# Patient Record
Sex: Female | Born: 1990 | Race: Black or African American | Hispanic: No | Marital: Single | Smoking: Never smoker
Health system: Southern US, Community
[De-identification: ages and names within clinical notes are randomized; demographics above are authoritative.]

---

## 2014-11-17 ENCOUNTER — Emergency Department: Payer: Self-pay

## 2014-11-17 ENCOUNTER — Encounter: Payer: Self-pay | Admitting: *Deleted

## 2014-11-17 ENCOUNTER — Emergency Department
Admission: EM | Admit: 2014-11-17 | Discharge: 2014-11-17 | Disposition: A | Discharge status: Home / Self Care | Payer: Self-pay | Attending: Emergency Medicine | Admitting: Emergency Medicine

## 2014-11-17 DIAGNOSIS — R102 Pelvic and perineal pain: Secondary | ICD-10-CM

## 2014-11-17 DIAGNOSIS — B9689 Other specified bacterial agents as the cause of diseases classified elsewhere: Secondary | ICD-10-CM

## 2014-11-17 DIAGNOSIS — N760 Acute vaginitis: Secondary | ICD-10-CM | POA: Insufficient documentation

## 2014-11-17 LAB — CHLAMYDIA/NGC RT PCR (ARMC ONLY)
CHLAMYDIA TR: DETECTED — AB
N gonorrhoeae: NOT DETECTED

## 2014-11-17 LAB — WET PREP, GENITAL
TRICH WET PREP: NONE SEEN
YEAST WET PREP: NONE SEEN

## 2014-11-17 LAB — COMPREHENSIVE METABOLIC PANEL
ALBUMIN: 4.9 g/dL (ref 3.5–5.0)
ALK PHOS: 44 U/L (ref 38–126)
ALT: 17 U/L (ref 14–54)
ANION GAP: 9 (ref 5–15)
AST: 27 U/L (ref 15–41)
BILIRUBIN TOTAL: 0.4 mg/dL (ref 0.3–1.2)
BUN: 9 mg/dL (ref 6–20)
CALCIUM: 8.9 mg/dL (ref 8.9–10.3)
CO2: 25 mmol/L (ref 22–32)
Chloride: 107 mmol/L (ref 101–111)
Creatinine, Ser: 0.86 mg/dL (ref 0.44–1.00)
GLUCOSE: 139 mg/dL — AB (ref 65–99)
POTASSIUM: 3.3 mmol/L — AB (ref 3.5–5.1)
Sodium: 141 mmol/L (ref 135–145)
TOTAL PROTEIN: 8.1 g/dL (ref 6.5–8.1)

## 2014-11-17 LAB — CBC
HEMATOCRIT: 42.5 % (ref 35.0–47.0)
HEMOGLOBIN: 14.2 g/dL (ref 12.0–16.0)
MCH: 32.9 pg (ref 26.0–34.0)
MCHC: 33.4 g/dL (ref 32.0–36.0)
MCV: 98.3 fL (ref 80.0–100.0)
Platelets: 257 10*3/uL (ref 150–440)
RBC: 4.33 MIL/uL (ref 3.80–5.20)
RDW: 13.1 % (ref 11.5–14.5)
WBC: 9.1 10*3/uL (ref 3.6–11.0)

## 2014-11-17 LAB — LIPASE, BLOOD: Lipase: 20 U/L — ABNORMAL LOW (ref 22–51)

## 2014-11-17 MED ORDER — SODIUM CHLORIDE 0.9 % IV SOLN
Freq: Once | INTRAVENOUS | Status: AC
  Administered 2014-11-17: 07:00:00 via INTRAVENOUS

## 2014-11-17 MED ORDER — MORPHINE SULFATE (PF) 4 MG/ML IV SOLN
4.0000 mg | Freq: Once | INTRAVENOUS | Status: AC
  Administered 2014-11-17: 4 mg via INTRAVENOUS
  Filled 2014-11-17: qty 1

## 2014-11-17 MED ORDER — METRONIDAZOLE 500 MG PO TABS
500.0000 mg | ORAL_TABLET | Freq: Once | ORAL | Status: AC
  Administered 2014-11-17: 500 mg via ORAL
  Filled 2014-11-17: qty 1

## 2014-11-17 MED ORDER — ONDANSETRON HCL 4 MG/2ML IJ SOLN
4.0000 mg | Freq: Once | INTRAMUSCULAR | Status: AC
  Administered 2014-11-17: 4 mg via INTRAVENOUS
  Filled 2014-11-17: qty 2

## 2014-11-17 MED ORDER — MORPHINE SULFATE (PF) 2 MG/ML IV SOLN
2.0000 mg | Freq: Once | INTRAVENOUS | Status: AC
  Administered 2014-11-17: 2 mg via INTRAVENOUS
  Filled 2014-11-17: qty 1

## 2014-11-17 MED ORDER — METRONIDAZOLE 500 MG PO TABS: 500.0000 mg | ORAL_TABLET | Freq: Once | ORAL | Status: DC

## 2014-11-17 NOTE — ED Notes (Signed)
Morphine given whilst pt in [place]

## 2014-11-17 NOTE — ED Notes (Signed)
Pt states unable to urinate at this time. Pelvic exam completed by Schaevitz MD.

## 2014-11-17 NOTE — ED Provider Notes (Signed)
[facility] Emergency Department Provider Note  ____________________________________________  Time seen: Approximately 710 AM  I have reviewed the triage vital signs and the nursing notes.   HISTORY  Chief Complaint Abdominal Pain; Back Pain; and Emesis    HPI Melissa Meyer is a 24 y.o. female without any chronic medical conditions who presents today with sudden onset pelvic pain which woke her from sleep slightly after 3 AM this morning. She admits to nausea and vomiting times one. No diarrhea. No blood in her vomit. Denies any vaginal bleeding or discharge. No history of STDs. Has not been pregnant in the past. No history of kidney stones. Says that the pain is across her lower abdomen and cramping and sharp.   History reviewed. No pertinent past medical history.  There are no active problems to display for this patient.   History reviewed. No pertinent past surgical history.  No current outpatient prescriptions on file.  Allergies Review of patient's allergies indicates no known allergies.  History reviewed. No pertinent family history.  Social History Social History  Substance Use Topics  . Smoking status: Never Smoker   . Smokeless tobacco: None  . Alcohol Use: No    Review of Systems Constitutional: No fever/chills Eyes: No visual changes. ENT: No sore throat. Cardiovascular: Denies chest pain. Respiratory: Denies shortness of breath. Gastrointestinal: No diarrhea.  No constipation. Genitourinary: Negative for dysuria. Musculoskeletal: Negative for back pain. Skin: Negative for rash. Neurological: Negative for headaches, focal weakness or numbness.  10-point ROS otherwise negative.  ____________________________________________   PHYSICAL EXAM:  VITAL SIGNS: ED Triage Vitals  Enc Vitals Group     BP 11/17/14 0549 117/85 mmHg     Pulse Rate 11/17/14 0549 74     Resp 11/17/14 0549 22     Temp 11/17/14 0549 97.5 F (36.4  C)     Temp Source 11/17/14 0549 Oral     SpO2 11/17/14 0549 97 %     Weight 11/17/14 0549 105 lb (47.628 kg)     Height 11/17/14 0549  (1.575 m)     Head Cir --      Peak Flow --      Pain Score 11/17/14 0551 10     Pain Loc --      Pain Edu? --      Excl. in GC? --     Constitutional: Alert and oriented. Mild distress  Eyes: Conjunctivae are normal. PERRL. EOMI. Head: Atraumatic. Nose: No congestion/rhinnorhea. Mouth/Throat: Mucous membranes are moist.  Oropharynx non-erythematous. Neck: No stridor.   Cardiovascular: Normal rate, regular rhythm. Grossly normal heart sounds.  Good peripheral circulation. Respiratory: Normal respiratory effort.  No retractions. Lungs CTAB. Gastrointestinal: Soft with mild tenderness to the lower abdomen. No guarding or rigidity. No distention. No abdominal bruits. No CVA tenderness. Genitourinary:  Normal external exam. Speculum exam with small amount of blood in the vault. There is no active bleeding. No lesions visualized. Bimanual exam without CMT. No uterine tenderness but does have bilateral, mild adnexal tenderness to palpation. No masses palpated to the adnexa. Musculoskeletal: No lower extremity tenderness nor edema.  No joint effusions. Neurologic:  Normal speech and language. No gross focal neurologic deficits are appreciated. No gait instability. Skin:  Skin is warm, dry and intact. No rash noted. Psychiatric: Mood and affect are normal. Speech and behavior are normal.  ____________________________________________   LABS (all labs ordered are listed, but only abnormal results are displayed)  Labs Reviewed  LIPASE, BLOOD -  Abnormal; Notable for the following:    Lipase 20 (*)    All other components within normal limits  COMPREHENSIVE METABOLIC PANEL - Abnormal; Notable for the following:    Potassium 3.3 (*)    Glucose, Bld 139 (*)    All other components within normal limits  WET PREP, GENITAL  CHLAMYDIA/NGC RT PCR ([facility]  ONLY)  CBC  URINALYSIS COMPLETEWITH MICROSCOPIC ([facility] ONLY)  POC URINE PREG, ED   ____________________________________________  EKG   ____________________________________________  RADIOLOGY  Pending ultrasound results ____________________________________________   PROCEDURES    ____________________________________________   INITIAL IMPRESSION / ASSESSMENT AND PLAN / ED COURSE  Pertinent labs & imaging results that were available during my care of the patient were reviewed by me and considered in my medical decision making (see chart for details).  ----------------------------------------- 7:45 AM on 11/17/2014 -----------------------------------------  At this time the patient's pain is controlled after 6 months of morphine. Pending radiology as well as the remainder of her Hinda Lenis for studies. Signed out to Dr. Carollee Massed. ____________________________________________   FINAL CLINICAL IMPRESSION(S) / ED DIAGNOSES  Acute pelvic and lower abdominal pain. Initial visit.    Myrna Blazer, MD 11/17/14 [phone]

## 2014-11-17 NOTE — ED Provider Notes (Signed)
-----------------------------------------   8:43 AM on 11/17/2014 -----------------------------------------   I accepted signout from Dr. Langston Masker. At that time the ultrasound was pending. Dr. Langston Masker who completed the pelvic exam with no noted cervical motion tenderness or other abnormality.  At this time, the patient appears well. She reports she feels much better. She is in no distress.  Her ultrasound is normal.  Her wet prep does show clue cells and white blood cells.  I will put her on metronidazole. She reports she can follow-up with family practice in [place]/[facility]    Darien Ramus, MD 11/17/14 [phone]

## 2014-11-17 NOTE — ED Notes (Signed)
Patient reports lower abd pain, lower back pain and vomiting x3.

## 2014-11-17 NOTE — Discharge Instructions (Signed)
The ultrasound of your pelvis was normal today. You do have clue cells and white blood cells on the wet prep. Take metronidazole for bacterial vaginosis. Follow-up with family practice at [facility] in [place] as planned or with [facility]. Return to the emergency department if you have further pain or other urgent concerns.  Bacterial Vaginosis Bacterial vaginosis is a vaginal infection that occurs when the normal balance of bacteria in the vagina is disrupted. It results from an overgrowth of certain bacteria. This is the most common vaginal infection in women of childbearing age. Treatment is important to prevent complications, especially in pregnant women, as it can cause a premature delivery. CAUSES  Bacterial vaginosis is caused by an increase in harmful bacteria that are normally present in smaller amounts in the vagina. Several different kinds of bacteria can cause bacterial vaginosis. However, the reason that the condition develops is not fully understood. RISK FACTORS Certain activities or behaviors can put you at an increased risk of developing bacterial vaginosis, including:  Having a new sex partner or multiple sex partners.  Douching.  Using an intrauterine device (IUD) for contraception. Women do not get bacterial vaginosis from toilet seats, bedding, swimming pools, or contact with objects around them. SIGNS AND SYMPTOMS  Some women with bacterial vaginosis have no signs or symptoms. Common symptoms include:  Grey vaginal discharge.  A fishlike odor with discharge, especially after sexual intercourse.  Itching or burning of the vagina and vulva.  Burning or pain with urination. DIAGNOSIS  Your health care provider will take a medical history and examine the vagina for signs of bacterial vaginosis. A sample of vaginal fluid may be taken. Your health care provider will look at this sample under a microscope to check for bacteria and abnormal cells. A vaginal pH test may  also be done.  TREATMENT  Bacterial vaginosis may be treated with antibiotic medicines. These may be given in the form of a pill or a vaginal cream. A second round of antibiotics may be prescribed if the condition comes back after treatment.  HOME CARE INSTRUCTIONS   Only take over-the-counter or prescription medicines as directed by your health care provider.  If antibiotic medicine was prescribed, take it as directed. Make sure you finish it even if you start to feel better.  Do not have sex until treatment is completed.  Tell all sexual partners that you have a vaginal infection. They should see their health care provider and be treated if they have problems, such as a mild rash or itching.  Practice safe sex by using condoms and only having one sex partner. SEEK MEDICAL CARE IF:   Your symptoms are not improving after 3 days of treatment.  You have increased discharge or pain.  You have a fever. MAKE SURE YOU:   Understand these instructions.  Will watch your condition.  Will get help right away if you are not doing well or get worse. FOR MORE INFORMATION  Centers for Disease Control and Prevention, Division of STD Prevention: SolutionApps.[employer].za American Sexual Health Association (ASHA): www.ashastd.org  Document Released: 03/16/2005 Document Revised: 01/04/2013 Document Reviewed: 10/26/2012 [facility] Patient Information 2015 [place], [place]. This information is not intended to replace advice given to you by your health care provider. Make sure you discuss any questions you have with your health care provider.  Pelvic Pain Female pelvic pain can be caused by many different things and start from a variety of places. Pelvic pain refers to pain that is located in the  lower half of the abdomen and between your hips. The pain may occur over a short period of time (acute) or may be reoccurring (chronic). The cause of pelvic pain may be related to disorders affecting the female  reproductive organs (gynecologic), but it may also be related to the bladder, kidney stones, an intestinal complication, or muscle or skeletal problems. Getting help right away for pelvic pain is important, especially if there has been severe, sharp, or a sudden onset of unusual pain. It is also important to get help right away because some types of pelvic pain can be life threatening.  CAUSES  Below are only some of the causes of pelvic pain. The causes of pelvic pain can be in one of several categories.   Gynecologic.  Pelvic inflammatory disease.  Sexually transmitted infection.  Ovarian cyst or a twisted ovarian ligament (ovarian torsion).  Uterine lining that grows outside the uterus (endometriosis).  Fibroids, cysts, or tumors.  Ovulation.  Pregnancy.  Pregnancy that occurs outside the uterus (ectopic pregnancy).  Miscarriage.  Labor.  Abruption of the placenta or ruptured uterus.  Infection.  Uterine infection (endometritis).  Bladder infection.  Diverticulitis.  Miscarriage related to a uterine infection (septic abortion).  Bladder.  Inflammation of the bladder (cystitis).  Kidney stone(s).  Gastrointestinal.  Constipation.  Diverticulitis.  Neurologic.  Trauma.  Feeling pelvic pain because of mental or emotional causes (psychosomatic).  Cancers of the bowel or pelvis. EVALUATION  Your caregiver will want to take a careful history of your concerns. This includes recent changes in your health, a careful gynecologic history of your periods (menses), and a sexual history. Obtaining your family history and medical history is also important. Your caregiver may suggest a pelvic exam. A pelvic exam will help identify the location and severity of the pain. It also helps in the evaluation of which organ system may be involved. In order to identify the cause of the pelvic pain and be properly treated, your caregiver may order tests. These tests may include:    A pregnancy test.  Pelvic ultrasonography.  An X-ray exam of the abdomen.  A urinalysis or evaluation of vaginal discharge.  Blood tests. HOME CARE INSTRUCTIONS   Only take over-the-counter or prescription medicines for pain, discomfort, or fever as directed by your caregiver.   Rest as directed by your caregiver.   Eat a balanced diet.   Drink enough fluids to make your urine clear or pale yellow, or as directed.   Avoid sexual intercourse if it causes pain.   Apply warm or cold compresses to the lower abdomen depending on which one helps the pain.   Avoid stressful situations.   Keep a journal of your pelvic pain. Write down when it started, where the pain is located, and if there are things that seem to be associated with the pain, such as food or your menstrual cycle.  Follow up with your caregiver as directed.  SEEK MEDICAL CARE IF:  Your medicine does not help your pain.  You have abnormal vaginal discharge. SEEK IMMEDIATE MEDICAL CARE IF:   You have heavy bleeding from the vagina.   Your pelvic pain increases.   You feel light-headed or faint.   You have chills.   You have pain with urination or blood in your urine.   You have uncontrolled diarrhea or vomiting.   You have a fever or persistent symptoms for more than 3 days.  You have a fever and your symptoms suddenly get worse.  You are being physically or sexually abused.  MAKE SURE YOU:  Understand these instructions.  Will watch your condition.  Will get help if you are not doing well or get worse. Document Released: 02/11/2004 Document Revised: 07/31/2013 Document Reviewed: 07/06/2011 [facility] Patient Information 2015 [place], [place]. This information is not intended to replace advice given to you by your health care provider. Make sure you discuss any questions you have with your health care provider.

## 2014-11-17 NOTE — ED Notes (Signed)
Report received from Rachel RN.  

## 2014-11-19 ENCOUNTER — Telehealth: Payer: Self-pay | Admitting: Emergency Medicine

## 2014-11-19 NOTE — ED Notes (Signed)
Called pt to inform of positive  Chlamydia test and need for tx.  Per dr Lenard Lance --call in azithromycin 1 gram po.  Explained need for partner tx for partner.  Called med to [facility].

## 2014-11-22 ENCOUNTER — Telehealth: Payer: Self-pay | Admitting: Emergency Medicine

## 2014-11-22 NOTE — ED Notes (Signed)
Pt called to verify what test was positive.  She said the med was too expensive and she is at health dept [place].  i explianed she needs tx for chlamydia.

## 2016-03-11 IMAGING — US US PELVIS COMPLETE
1 series · 14 of 25 positions shown · non-contrast
Comparison: None.

CLINICAL DATA: Pelvic pain for 5 hr

EXAM:
TRANSABDOMINAL AND TRANSVAGINAL ULTRASOUND OF PELVIS
DOPPLER ULTRASOUND OF OVARIES
TECHNIQUE: Both transabdominal and transvaginal ultrasound examinations of the
pelvis were performed. Transabdominal technique was performed for
global imaging of the pelvis including uterus, ovaries, adnexal
regions, and pelvic cul-de-sac.
It was necessary to proceed with endovaginal exam following the
transabdominal exam to visualize the ovaries. Color and duplex
Doppler ultrasound was utilized to evaluate blood flow to the
ovaries.

[Series 1: us pelvis complete · 0.21mm/px · 14 of 78 slices shown]
[im 1/78]
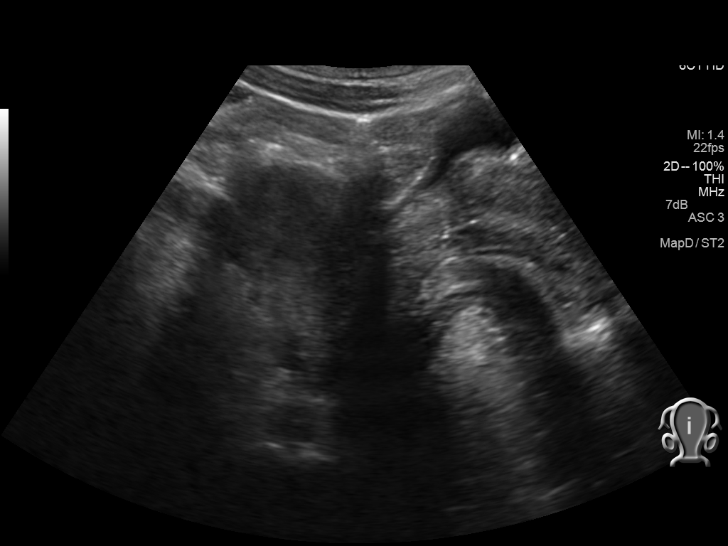
[im 7/78]
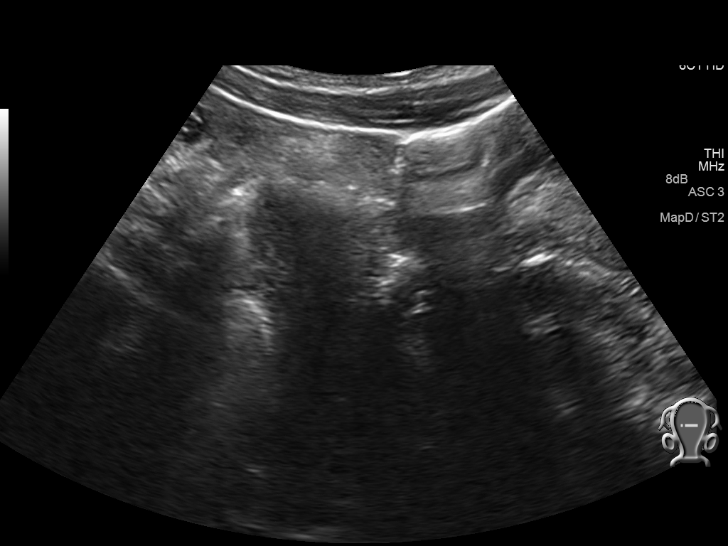
[im 13/78]
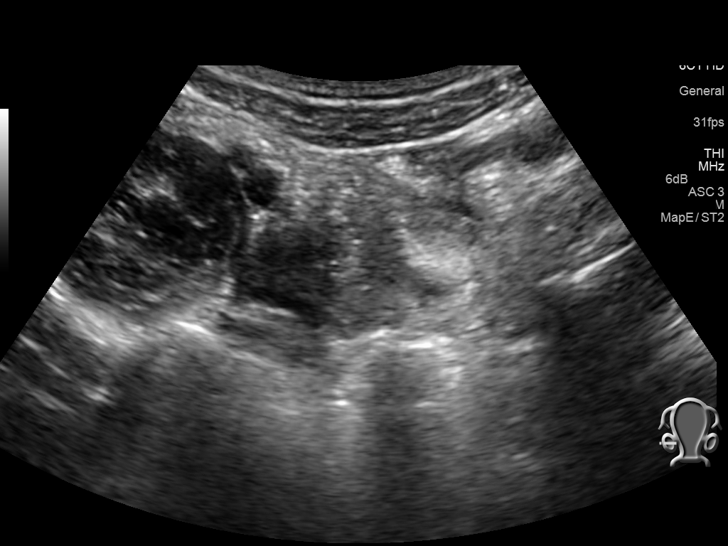
[im 20/78]
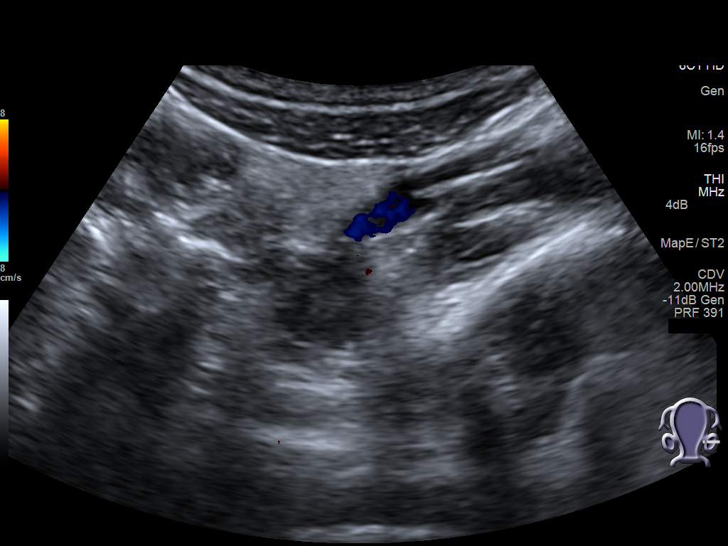
[im 26/78]
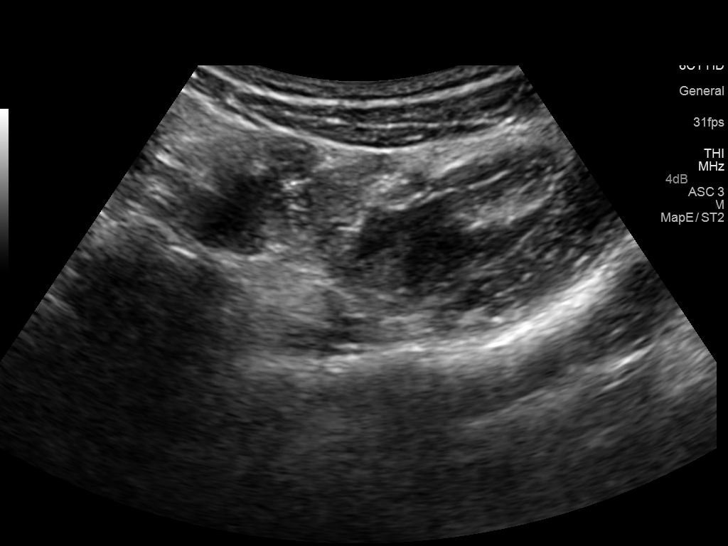
[im 29/78]
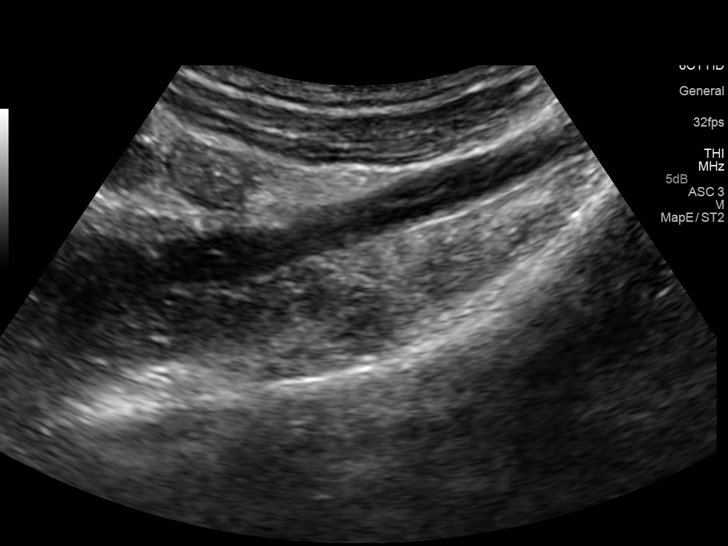
[im 36/78]
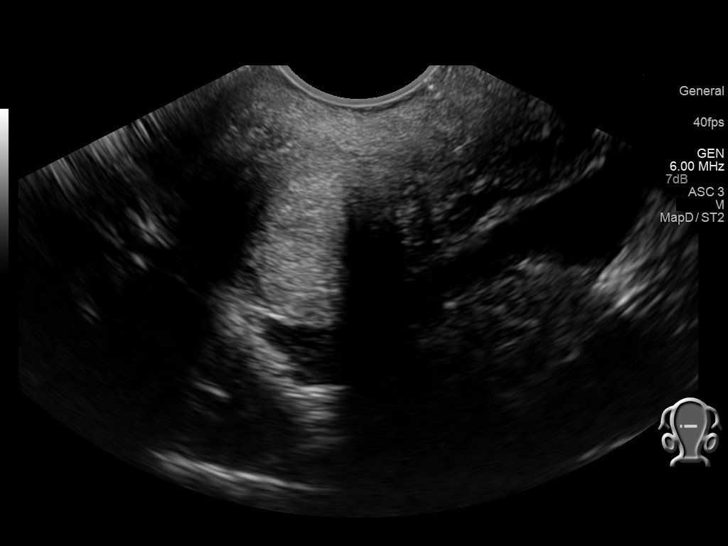
[im 42/78]
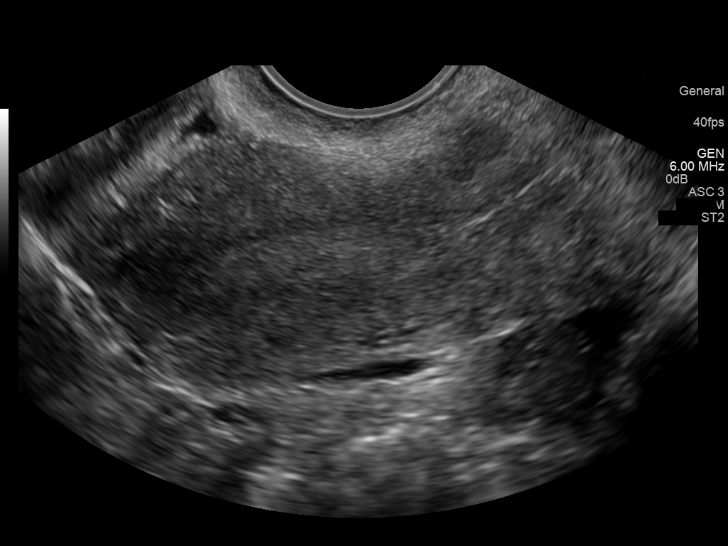
[im 49/78]
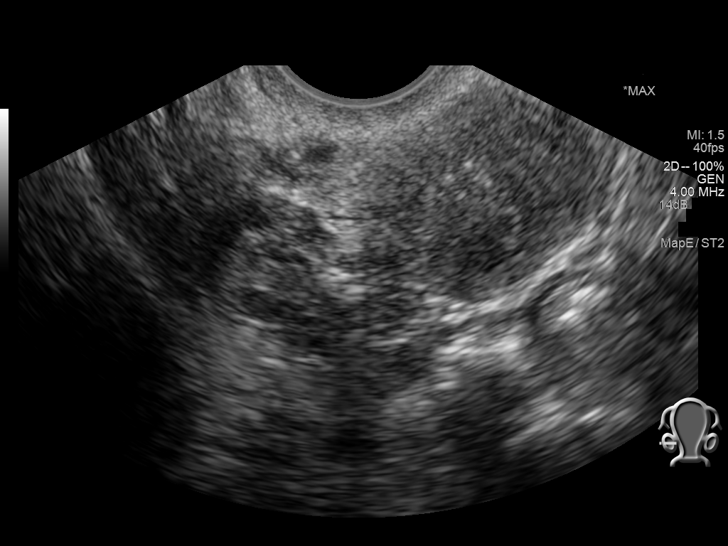
[im 52/78]
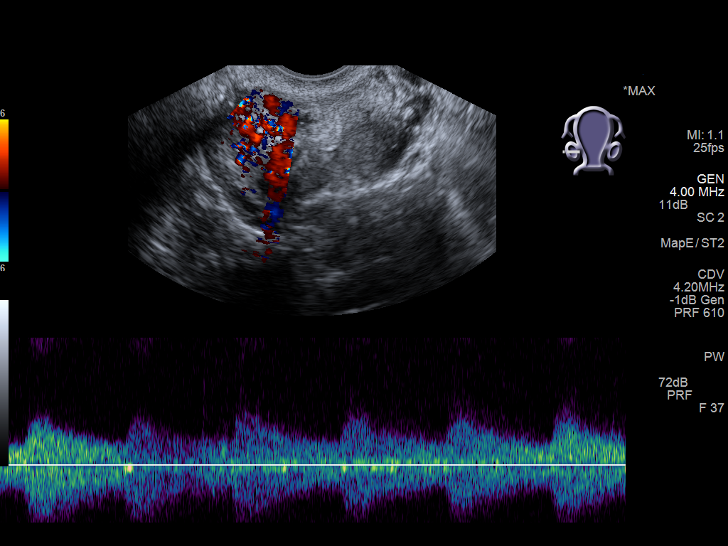
[im 58/78]
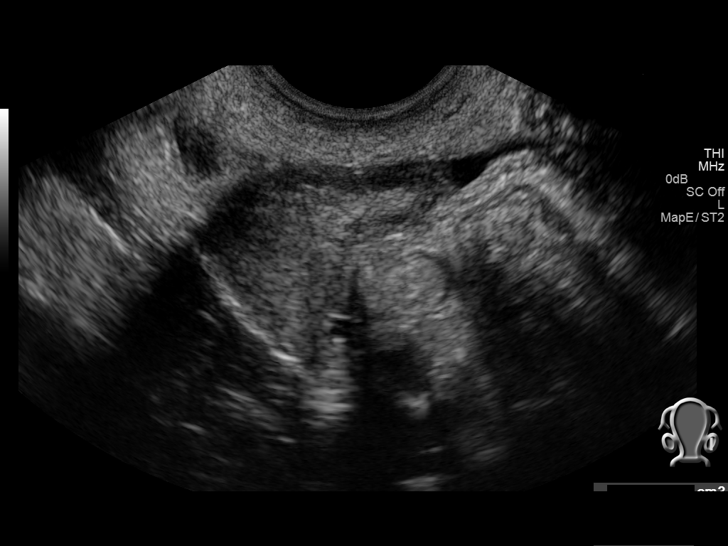
[im 65/78]
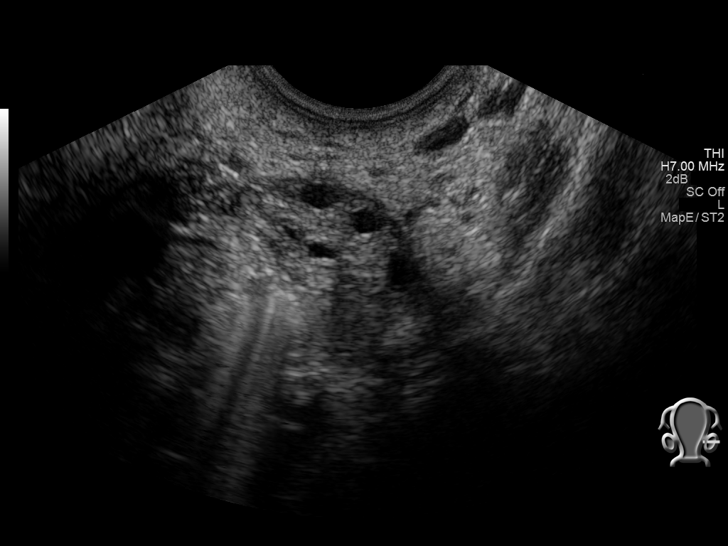
[im 71/78]
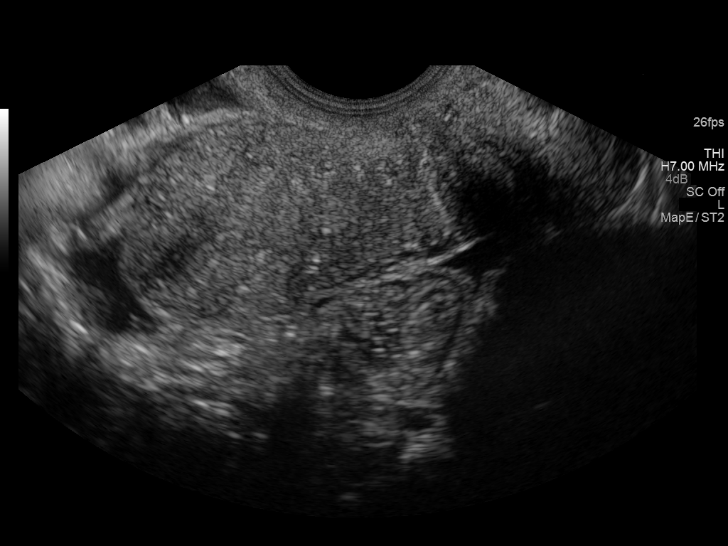
[im 78/78]
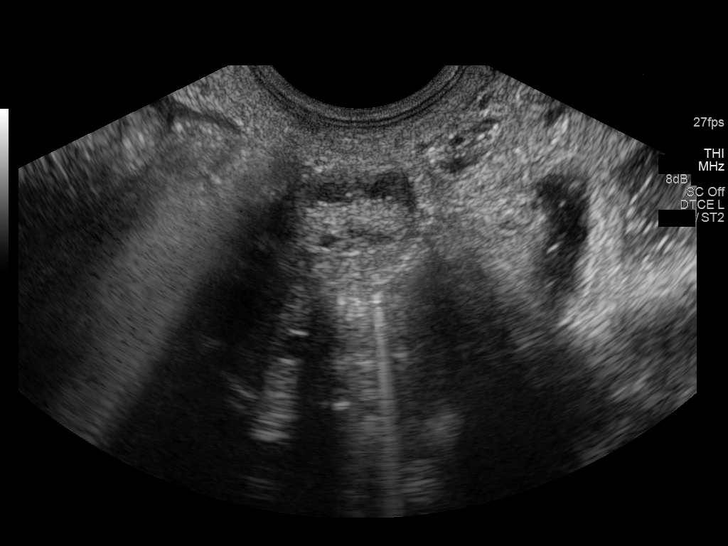

[14 of 25 positions shown; findings below may reference images not displayed]

FINDINGS: Uterus

Measurements: 7.4 x 3.1 x 2.9 cm.. No fibroids or other mass
visualized.

Endometrium

Thickness: 6 mm..  No focal abnormality visualized.

Right ovary

Measurements: 3.0 x 1.5 x 1.6 cm.. Normal appearance/no adnexal
mass.

Left ovary

Measurements: 2.9 x 1.9 x 2.4 cm.. Normal appearance/no adnexal
mass.

Pulsed Doppler evaluation of both ovaries demonstrates normal
low-resistance arterial and venous waveforms.

Other findings

Mild free fluid is noted likely physiologic in nature.
IMPRESSION: No acute abnormality noted.

## 2022-08-01 ENCOUNTER — Ambulatory Visit
Admission: EM | Admit: 2022-08-01 | Discharge: 2022-08-01 | Disposition: A | Discharge status: Home / Self Care | Payer: PRIVATE HEALTH INSURANCE

## 2022-08-01 DIAGNOSIS — R519 Headache, unspecified: Secondary | ICD-10-CM

## 2022-08-01 NOTE — ED Triage Notes (Signed)
Pt c/o migraine x3 days, states she recently gave birth on Sunday & had nexplanon inserted on Tuesday by midwife @[facility]  & is wondering if that is contributing to her migraine. Has tried tylenol,ibuprofen, BC & Excedrin w/o relief. Denies any blurred vision,dizziness,light sensitivity or hx of migraines.

## 2022-08-01 NOTE — Discharge Instructions (Signed)
Please go to the ER for evaluation of your headache.

## 2022-08-01 NOTE — ED Notes (Signed)
Patient is being discharged from the Urgent Care and sent to the [facility] Emergency Department via private vehicle . Per Becky Augusta, NP, patient is in need of higher level of care due to recent vaginal delivery and severe Headache. Patient is aware and verbalizes understanding of plan of care.  Vitals:   08/01/22 1248  BP: 128/85  Pulse: 81  Resp: 16  Temp: 98.3 F (36.8 C)  SpO2: 96%

## 2022-08-01 NOTE — ED Provider Notes (Signed)
[facility]    CSN: [id number] Arrival date & time: 08/01/22  1211      History   Chief Complaint Chief Complaint  Patient presents with   Migraine    HPI Melissa Meyer is a 32 y.o. female.   HPI  32 year old female who is 6 days postpartum here for evaluation of a headache that is been present for the last 3 days.  She had a Nexplanon placed by the midwife at [facility] 4 days ago and she is wondering if this may be contributing to her headache.  However, the patient did have an epidural for delivery.  She denies any fever, nausea, or vomiting.  Patient also denies blurred vision or dizziness.  No light sensitivity.  She has not called her midwife.  History reviewed. No pertinent past medical history.  There are no problems to display for this patient.   History reviewed. No pertinent surgical history.  OB History   No obstetric history on file.      Home Medications    Prior to Admission medications   Not on File    Family History History reviewed. No pertinent family history.  Social History Social History   Tobacco Use   Smoking status: Never  Substance Use Topics   Alcohol use: No   Drug use: No     Allergies   Patient has no known allergies.   Review of Systems Review of Systems   Physical Exam Triage Vital Signs ED Triage Vitals  Enc Vitals Group     BP 08/01/22 1248 128/85     Pulse Rate 08/01/22 1248 81     Resp 08/01/22 1248 16     Temp 08/01/22 1248 98.3 F (36.8 C)     Temp Source 08/01/22 1248 Oral     SpO2 08/01/22 1248 96 %     Weight 08/01/22 1248 140 lb (63.5 kg)     Height 08/01/22 1248 5\' 2"  (1.575 m)     Head Circumference --      Peak Flow --      Pain Score 08/01/22 1254 10     Pain Loc --      Pain Edu? --      Excl. in GC? --    No data found.  Updated Vital Signs BP 128/85 (BP Location: Left Arm)   Pulse 81   Temp 98.3 F (36.8 C) (Oral)   Resp 16   Ht 5\' 2"  (1.575 m)   Wt 140 lb (63.5 kg)    SpO2 96%   BMI 25.61 kg/m   Visual Acuity Right Eye Distance:   Left Eye Distance:   Bilateral Distance:    Right Eye Near:   Left Eye Near:    Bilateral Near:     Physical Exam   UC Treatments / Results  Labs (all labs ordered are listed, but only abnormal results are displayed) Labs Reviewed - No data to display  EKG   Radiology No results found.  Procedures Procedures (including critical care time)  Medications Ordered in UC Medications - No data to display  Initial Impression / Assessment and Plan / UC Course  I have reviewed the triage vital signs and the nursing notes.  Pertinent labs & imaging results that were available during my care of the patient were reviewed by me and considered in my medical decision making (see chart for details).   The patient is 6 days postpartum after having an epidural for delivery and  she is complaining of headache this been present for last 3 days.  She denies any visual changes, dizziness, fever, nausea, or vomiting.  However, in the setting of being 6 days postpartum I feel that she would better be served to be evaluated in the emergency department.  I do feel she is safe to travel via POV and she will go to [facility].   Final Clinical Impressions(s) / UC Diagnoses   Final diagnoses:  Bad headache     Discharge Instructions      Please go to the ER for evaluation of your headache.     ED Prescriptions   None    PDMP not reviewed this encounter.   Becky Augusta, NP 08/01/22 1318
